# Patient Record
Sex: Female | Born: 1965 | Race: Black or African American | Hispanic: No | Marital: Married | Smoking: Never smoker
Health system: Southern US, Community
[De-identification: ages and names within clinical notes are randomized; demographics above are authoritative.]

## PROBLEM LIST (undated history)

## (undated) DIAGNOSIS — E079 Disorder of thyroid, unspecified: Secondary | ICD-10-CM

## (undated) HISTORY — PX: CHOLECYSTECTOMY: SHX55

---

## 2002-01-26 ENCOUNTER — Other Ambulatory Visit: Admission: RE | Admit: 2002-01-26 | Discharge: 2002-01-26 | Payer: Self-pay | Admitting: *Deleted

## 2002-03-06 ENCOUNTER — Ambulatory Visit (HOSPITAL_COMMUNITY): Admission: RE | Admit: 2002-03-06 | Discharge: 2002-03-06 | Payer: Self-pay | Admitting: *Deleted

## 2002-03-06 ENCOUNTER — Encounter: Payer: Self-pay | Admitting: *Deleted

## 2006-12-12 ENCOUNTER — Emergency Department (HOSPITAL_COMMUNITY): Admission: EM | Admit: 2006-12-12 | Discharge: 2006-12-12 | Payer: Self-pay | Admitting: Family Medicine

## 2015-09-18 ENCOUNTER — Other Ambulatory Visit: Payer: Self-pay

## 2015-09-18 ENCOUNTER — Emergency Department (HOSPITAL_COMMUNITY): Payer: BLUE CROSS/BLUE SHIELD

## 2015-09-18 ENCOUNTER — Encounter (HOSPITAL_COMMUNITY): Payer: Self-pay

## 2015-09-18 ENCOUNTER — Emergency Department (HOSPITAL_COMMUNITY)
Admission: EM | Admit: 2015-09-18 | Discharge: 2015-09-18 | Disposition: A | Discharge status: Home / Self Care | Payer: BLUE CROSS/BLUE SHIELD | Attending: Emergency Medicine | Admitting: Emergency Medicine

## 2015-09-18 DIAGNOSIS — R42 Dizziness and giddiness: Secondary | ICD-10-CM | POA: Diagnosis not present

## 2015-09-18 DIAGNOSIS — R634 Abnormal weight loss: Secondary | ICD-10-CM | POA: Diagnosis not present

## 2015-09-18 DIAGNOSIS — R000 Tachycardia, unspecified: Secondary | ICD-10-CM | POA: Insufficient documentation

## 2015-09-18 DIAGNOSIS — R002 Palpitations: Secondary | ICD-10-CM | POA: Diagnosis not present

## 2015-09-18 DIAGNOSIS — E069 Thyroiditis, unspecified: Secondary | ICD-10-CM | POA: Insufficient documentation

## 2015-09-18 DIAGNOSIS — E0590 Thyrotoxicosis, unspecified without thyrotoxic crisis or storm: Secondary | ICD-10-CM | POA: Diagnosis not present

## 2015-09-18 DIAGNOSIS — R197 Diarrhea, unspecified: Secondary | ICD-10-CM | POA: Insufficient documentation

## 2015-09-18 DIAGNOSIS — E010 Iodine-deficiency related diffuse (endemic) goiter: Secondary | ICD-10-CM

## 2015-09-18 DIAGNOSIS — R112 Nausea with vomiting, unspecified: Secondary | ICD-10-CM | POA: Diagnosis not present

## 2015-09-18 DIAGNOSIS — E041 Nontoxic single thyroid nodule: Secondary | ICD-10-CM

## 2015-09-18 DIAGNOSIS — R0602 Shortness of breath: Secondary | ICD-10-CM | POA: Diagnosis not present

## 2015-09-18 LAB — URINALYSIS, ROUTINE W REFLEX MICROSCOPIC
GLUCOSE, UA: NEGATIVE mg/dL
KETONES UR: 15 mg/dL — AB
Leukocytes, UA: NEGATIVE
Nitrite: NEGATIVE
PROTEIN: 30 mg/dL — AB
Specific Gravity, Urine: 1.029 (ref 1.005–1.030)
pH: 5 (ref 5.0–8.0)

## 2015-09-18 LAB — URINE MICROSCOPIC-ADD ON

## 2015-09-18 LAB — CBC
HCT: 41.4 % (ref 36.0–46.0)
Hemoglobin: 12.9 g/dL (ref 12.0–15.0)
MCH: 24.5 pg — AB (ref 26.0–34.0)
MCHC: 31.2 g/dL (ref 30.0–36.0)
MCV: 78.6 fL (ref 78.0–100.0)
PLATELETS: 217 10*3/uL (ref 150–400)
RBC: 5.27 MIL/uL — ABNORMAL HIGH (ref 3.87–5.11)
RDW: 14.7 % (ref 11.5–15.5)
WBC: 6.5 10*3/uL (ref 4.0–10.5)

## 2015-09-18 LAB — BASIC METABOLIC PANEL
Anion gap: 10 (ref 5–15)
BUN: 14 mg/dL (ref 6–20)
CHLORIDE: 106 mmol/L (ref 101–111)
CO2: 20 mmol/L — AB (ref 22–32)
CREATININE: 0.52 mg/dL (ref 0.44–1.00)
Calcium: 10.8 mg/dL — ABNORMAL HIGH (ref 8.9–10.3)
GFR calc Af Amer: >60 mL/min (ref >60)
GFR calc non Af Amer: >60 mL/min (ref >60)
GLUCOSE: 104 mg/dL — AB (ref 65–99)
Potassium: 3.9 mmol/L (ref 3.5–5.1)
Sodium: 136 mmol/L (ref 135–145)

## 2015-09-18 LAB — HEPATIC FUNCTION PANEL
ALBUMIN: 3.7 g/dL (ref 3.5–5.0)
ALT: 37 U/L (ref 14–54)
AST: 37 U/L (ref 15–41)
Alkaline Phosphatase: 183 U/L — ABNORMAL HIGH (ref 38–126)
BILIRUBIN DIRECT: 0.1 mg/dL (ref 0.1–0.5)
BILIRUBIN INDIRECT: 0.4 mg/dL (ref 0.3–0.9)
BILIRUBIN TOTAL: 0.5 mg/dL (ref 0.3–1.2)
Total Protein: 7.2 g/dL (ref 6.5–8.1)

## 2015-09-18 LAB — I-STAT TROPONIN, ED
Troponin i, poc: 0 ng/mL (ref 0.00–0.08)
Troponin i, poc: 0 ng/mL (ref 0.00–0.08)

## 2015-09-18 LAB — I-STAT BETA HCG BLOOD, ED (MC, WL, AP ONLY)

## 2015-09-18 LAB — T4, FREE: Free T4: 5.36 ng/dL — ABNORMAL HIGH (ref 0.61–1.12)

## 2015-09-18 LAB — CBG MONITORING, ED: Glucose-Capillary: 116 mg/dL — ABNORMAL HIGH (ref 65–99)

## 2015-09-18 LAB — TSH: TSH: 0.024 u[IU]/mL — AB (ref 0.350–4.500)

## 2015-09-18 MED ORDER — SODIUM CHLORIDE 0.9 % IV BOLUS (SEPSIS)
1000.0000 mL | Freq: Once | INTRAVENOUS | Status: AC
Start: 2015-09-18 — End: 2015-09-18
  Administered 2015-09-18: 1000 mL via INTRAVENOUS

## 2015-09-18 MED ORDER — ONDANSETRON HCL 8 MG PO TABS: 8.0000 mg | ORAL_TABLET | Freq: Three times a day (TID) | ORAL | 0 refills | Status: DC | PRN

## 2015-09-18 MED ORDER — METOPROLOL TARTRATE 25 MG PO TABS: 25.0000 mg | ORAL_TABLET | Freq: Every day | ORAL | 0 refills | Status: AC

## 2015-09-18 MED ORDER — METOPROLOL TARTRATE 5 MG/5ML IV SOLN
2.5000 mg | Freq: Once | INTRAVENOUS | Status: AC
  Administered 2015-09-18: 2.5 mg via INTRAVENOUS
  Filled 2015-09-18: qty 5

## 2015-09-18 MED ORDER — ONDANSETRON HCL 4 MG/2ML IJ SOLN
4.0000 mg | Freq: Once | INTRAMUSCULAR | Status: AC
  Administered 2015-09-18: 4 mg via INTRAVENOUS
  Filled 2015-09-18: qty 2

## 2015-09-18 MED ORDER — METHIMAZOLE 10 MG PO TABS: 30.0000 mg | ORAL_TABLET | Freq: Every day | ORAL | 0 refills | Status: AC

## 2015-09-18 NOTE — ED Notes (Signed)
Waiting on Troponin to result. Verbalizes no needs at this time

## 2015-09-18 NOTE — ED Provider Notes (Signed)
[facility]-EMERGENCY DEPT Provider Note   CSN: [id number] Arrival date & time: 09/18/15  9562  First Provider Contact:  First MD Initiated Contact with Patient 09/18/15 [phone]        History   Chief Complaint Chief Complaint  Patient presents with  . Weakness  . Palpitations    HPI Stacy Riggs is a 50 y.o. Otherwise healthy female, who presents to the ED with complaints of intermittent lightheadedness, palpitations, sweatiness/diaphoresis, and shortness of breath mostly with exertion that began around 4 weeks ago and has been becoming more constant. This morning she was in the shower when she developed lightheadedness, palpitations, and nausea stating that it was the worst it had been since it started, causing her to have to stop showering and rest in order to feel somewhat better. Her symptoms mostly occur with exertion, and somewhat improved with rest. She took 325 mg aspirin this morning but did not notice a difference. She vomited once this morning, nonbloody and nonbilious, but has not vomited since. She has noticed a 40 pound weight loss in 7 months, and has reported having looser stools over the last several months, states that she has had 3 episodes of nonbloody loose diarrhea stools per day. She thought she was lactose intolerant, but has tried Lactaid without any relief. Positive family history of thyroid issues but she denies any known personal history of heart or thyroid disease. She is otherwise healthy. Takes no medications.  She denies any fevers, chills, chest pain, cough, wheezing, leg swelling, recent travel/surgery/immobilization, personal or family history of DVT/PE, estrogen use, orthopnea, claudication, syncope, abdominal pain, hematemesis, constipation, obstipation, melena, hematochezia, hematemesis, dysuria, hematuria, numbness, tingling, or focal weakness. She is a nonsmoker. Denies family history of MI or CAD.   The history is provided by the patient. No language  interpreter was used.  Weakness  Associated symptoms include diaphoresis, nausea and vomiting. Pertinent negatives include no abdominal pain, arthralgias, chest pain, chills, coughing, fever, myalgias, numbness or weakness.  Palpitations   This is a recurrent problem. The current episode started more than 1 week ago. The problem occurs constantly. The problem has not changed since onset.The problem is associated with an unknown factor. Associated symptoms include diaphoresis, nausea, vomiting and shortness of breath (with exertion). Pertinent negatives include no fever, no numbness, no chest pain, no claudication, no orthopnea, no syncope, no abdominal pain, no lower extremity edema, no weakness and no cough. She has tried bed rest for the symptoms. The treatment provided moderate relief. Risk factors include family history. Her past medical history does not include hyperthyroidism.    History reviewed. No pertinent past medical history.  There are no active problems to display for this patient.   History reviewed. No pertinent surgical history.  OB History    No data available       Home Medications    Prior to Admission medications   Not on File    Family History No family history on file.  Social History Social History  Substance Use Topics  . Smoking status: Never Smoker  . Smokeless tobacco: Never Used  . Alcohol use Not on file     Allergies   Review of patient's allergies indicates no known allergies.   Review of Systems Review of Systems  Constitutional: Positive for diaphoresis and unexpected weight change (40# in 7 months). Negative for chills and fever.  Respiratory: Positive for shortness of breath (with exertion). Negative for cough and wheezing.   Cardiovascular: Positive for palpitations.  Negative for chest pain, orthopnea, claudication, leg swelling and syncope.  Gastrointestinal: Positive for diarrhea, nausea and vomiting. Negative for abdominal pain,  anal bleeding, blood in stool and constipation.  Genitourinary: Negative for dysuria and hematuria.  Musculoskeletal: Negative for arthralgias and myalgias.  Skin: Negative for color change.  Allergic/Immunologic: Negative for immunocompromised state.  Neurological: Positive for light-headedness. Negative for syncope, weakness and numbness.  Psychiatric/Behavioral: Negative for confusion.   10 Systems reviewed and are negative for acute change except as noted in the HPI.   Physical Exam Updated Vital Signs BP (!) 164/106 (BP Location: Right Arm)   Pulse 102   Temp 98.1 F (36.7 C) (Oral)   Resp 20   Ht 5\' 4"  (1.626 m)   Wt 62.6 kg   SpO2 100%   BMI 23.69 kg/m   Physical Exam  Constitutional: She is oriented to person, place, and time. Vital signs are normal. She appears well-developed and well-nourished.  Non-toxic appearance. No distress.  Afebrile, nontoxic, NAD  HENT:  Head: Normocephalic and atraumatic.  Mouth/Throat: Oropharynx is clear and moist and mucous membranes are normal.  Eyes: Conjunctivae and EOM are normal. Right eye exhibits no discharge. Left eye exhibits no discharge.  Very slight exophthalmos  Neck: Trachea normal, normal range of motion and phonation normal. Neck supple. No JVD present. No tracheal tenderness present. Carotid bruit is not present. Thyromegaly present.  Thyromegaly of the R side of the thyroid, consistent with goiter, no palpable pulsatile region, no bruit or thrill audible, no tenderness, trachea midline without deviation or tenderness, phonation WNL.   Cardiovascular: Regular rhythm, normal heart sounds and intact distal pulses.  Tachycardia present.  Exam reveals no gallop and no friction rub.   No murmur heard. Tachycardic in the 110s, reg rhythm, nl s1/s2, no m/r/g, distal pulses intact, no pedal edema  Pulmonary/Chest: Effort normal and breath sounds normal. No respiratory distress. She has no decreased breath sounds. She has no wheezes.  She has no rhonchi. She has no rales.  CTAB in all lung fields, no w/r/r, no hypoxia or increased WOB, speaking in full sentences, SpO2 100% on RA   Abdominal: Soft. Normal appearance and bowel sounds are normal. She exhibits no distension. There is no tenderness. There is no rigidity, no rebound, no guarding, no CVA tenderness, no tenderness at McBurney's point and negative Murphy's sign.  Musculoskeletal: Normal range of motion.  MAE x4 Strength and sensation grossly intact Distal pulses intact Gait steady No pedal edema, neg homan's bilaterally   Neurological: She is alert and oriented to person, place, and time. She has normal strength. No sensory deficit.  Skin: Skin is warm, dry and intact. No rash noted.  Psychiatric: She has a normal mood and affect.  Nursing note and vitals reviewed.  1610 Orthostatic Vital Signs Orthostatic Lying -  BP- Lying: 125/82  Pulse- Lying: 107  Orthostatic Sitting -  BP- Sitting: 123/95  Pulse- Sitting: 107  Orthostatic Standing at 0 minutes -  BP- Standing at 0 minutes: 119/72  Pulse- Standing at 0 minutes: 112      ED Treatments / Results  Labs (all labs ordered are listed, but only abnormal results are displayed) Labs Reviewed  BASIC METABOLIC PANEL - Abnormal; Notable for the following:       Result Value   CO2 20 (*)    Glucose, Bld 104 (*)    Calcium 10.8 (*)    All other components within normal limits  CBC - Abnormal; Notable for  the following:    RBC 5.27 (*)    MCH 24.5 (*)    All other components within normal limits  URINALYSIS, ROUTINE W REFLEX MICROSCOPIC (NOT AT [facility]) - Abnormal; Notable for the following:    Color, Urine AMBER (*)    APPearance CLOUDY (*)    Hgb urine dipstick MODERATE (*)    Bilirubin Urine SMALL (*)    Ketones, ur 15 (*)    Protein, ur 30 (*)    All other components within normal limits  TSH - Abnormal; Notable for the following:    TSH 0.024 (*)    All other components within normal limits  T4, FREE  - Abnormal; Notable for the following:    Free T4 5.36 (*)    All other components within normal limits  URINE MICROSCOPIC-ADD ON - Abnormal; Notable for the following:    Squamous Epithelial / LPF 6-30 (*)    Bacteria, UA FEW (*)    Casts HYALINE CASTS (*)    All other components within normal limits  CBG MONITORING, ED - Abnormal; Notable for the following:    Glucose-Capillary 116 (*)    All other components within normal limits  T3  THYROID STIMULATING IMMUNOGLOBULIN  HEPATIC FUNCTION PANEL  I-STAT TROPOININ, ED  I-STAT BETA HCG BLOOD, ED ([facility], [facility], [facility] ONLY)  I-STAT TROPOININ, ED    EKG  EKG Interpretation  Date/Time:  Sunday September 18 2015 08:48:32 EDT Ventricular Rate:  102 PR Interval:  136 QRS Duration: 74 QT Interval:  340 QTC Calculation: 443 R Axis:   66 Text Interpretation:  Sinus tachycardia Possible Left atrial enlargement Borderline ECG J point elevation anteriorly d/w Dr. Katrinka Blazing No previous ECGs available Confirmed by Manus Gunning  MD, STEPHEN [phone]) on 09/18/2015 9:48:15 AM       Radiology Dg Chest 2 View  Result Date: 09/18/2015 CLINICAL DATA:  Intermittent episodes of lightheadedness, nausea, tachycardia for 2 weeks EXAM: CHEST  2 VIEW COMPARISON:  None. FINDINGS: The heart size and mediastinal contours are within normal limits. Both lungs are clear. The visualized skeletal structures are unremarkable. IMPRESSION: No active cardiopulmonary disease. Electronically Signed   By: Charlett Nose M.D.   On: 09/18/2015 10:32  US Thyroid  Result Date: 09/18/2015 CLINICAL DATA:  Hyperthyroidism. EXAM: THYROID ULTRASOUND TECHNIQUE: Ultrasound examination of the thyroid gland and adjacent soft tissues was performed. COMPARISON:  None. FINDINGS: Right thyroid lobe Measurements: 5.7 x 2.8 x 3.2 cm. Thyroid tissue is heterogeneous and hypervascular. There is an anechoic structure within the superior right thyroid lobe compatible with a cyst. This cystic structure measures 1.2 x 1.1  x 0.8 cm. Left thyroid lobe Measurements: 5.4 x 2.7 x 2.4 cm. Mildly complex nodule in the superior left thyroid lobe measures 0.6 x 0.6 x 0.4 cm. Left thyroid tissue is diffusely heterogeneous and hypervascular. Echotexture of the left thyroid lobe is similar to the right thyroid lobe. Isthmus Thickness: 0.9 cm.  No nodules visualized. Lymphadenopathy None visualized. IMPRESSION: Thyroid tissue is enlarged, heterogeneous and hypervascular. These findings can be associated with thyroiditis. Bilateral thyroid nodules/cysts. Largest structure is a cyst in the right thyroid lobe measuring up to 1.2 cm. Electronically Signed   By: Richarda Overlie M.D.   On: 09/18/2015 13:33   Procedures Procedures (including critical care time)  Medications Ordered in ED Medications  sodium chloride 0.9 % bolus 1,000 mL (0 mLs Intravenous Stopped 09/18/15 1140)  ondansetron (ZOFRAN) injection 4 mg (4 mg Intravenous Given 09/18/15 1008)  metoprolol (  LOPRESSOR) injection 2.5 mg (2.5 mg Intravenous Given 09/18/15 1043)     Initial Impression / Assessment and Plan / ED Course  I have reviewed the triage vital signs and the nursing notes.  Pertinent labs & imaging results that were available during my care of the patient were reviewed by me and considered in my medical decision making (see chart for details).  Clinical Course    50 y.o. female here with intermittent lightheadedness and palpitations with exertion over the last 4 weeks, this morning while she was showering she developed more significant symptoms which prompted her to come to the ER. She also had shortness of breath, nausea, and vomited once. She has noticed that she has been having looser stools and weight loss over the last several months. On exam, tachycardic in the 110s, mildly hypertensive 164/106, no tachypnea or hypoxia, no pedal edema, right thyroid prominent on exam consistent with goiter, no audible thrill or bruit, no palpable pulsatile area on goiter.  Doesn't appear to be in thyroid storm/thyrotoxicosis, but all her symptoms are consistent with hyperthyroidism. Thyroid issues run in the family. CBG WNL, Trop neg, CBC WNL. EKG with Jpoint elevation that appeared initially to look like ST elevation but Dr. Manus Gunning my attending d/w Dr. Katrinka Blazing [facility] and he stated this doesn't seem consistent with STEMI and was more consistent with J point elevation, no acute ischemic findings.  Will obtain TSH/Free T4. U/A pending, BMP in process. Doubt need for D-dimer, especially since she denies CP, and her symptoms are most consistent with hyperthyroidism. Will check betaHCG and if neg, will give metoprolol 2.5mg  IV. Will give fluids and zofran, check orthostatics and CXR and then reassess shortly   10:25 AM Orthostatics neg, does get tachycardic with this procedure but does not meet criteria for orthostatics. HCG neg. U/A with some hgb but appears contaminated, no evidence of UTI. BMP WNL. Thyroid studies pending. CXR not yet done. Will go ahead and give metoprolol 2.5mg  now and reassess shortly.    12:15 PM  TSH low at 0.024, Free T4 elevated at 5.36. CXR clear. Will discuss case with endocrinology at [facility] to have close f/up and discuss whether we want to start PTU and beta-blockers. Palpitations and HR/BP improved after metoprolol, pt feeling much better now. Will add-on thyroid U/S while she's here, while we're waiting on endocrinology to return our call. Will also get second troponin since we're waiting on the consult call. Will reassess shortly.   12:44 PM Dr. Samuella Cota fellow of endocrinology at [facility] returning page, wants total T3 added on, and TSI antibodies as well. Also wants LFTs. Would start on propranolol 20mg  BID and methimazole 10mg  TID (counsel on WBC lowering side effect so if infx develops she needs close f/up, and LFT monitoring), and f/up in 1-2 weeks with endocrinology, will call me back on whether it needs to be their office or if there's a  better place here around [facility] for her to f/up with. Would also want U/S of thyroid, which is currently in process  1:03 PM Dr. Samuella Cota returning call, states that instead would like methimazole 30mg  daily, and metoprolol 12.5mg  BID or 25mg  daily (either way), and their office will contact pt to f/up with them.   3:00 PM  Second trop neg. U/S of thyroid showing hypervascular heterogenous enlargement of thyroid with b/l nodules/cysts with largest one on the R measuring 1.2cm diameter. Findings c/w thyroiditis. HR and BP improved after metoprolol. Symptoms improved, tolerating PO well here. Feels  much better. Rx zofran given. Will start methimazole and metoprolol as instructed previously. Will have her f/up with [facility] in 1wk to establish care and recheck, and with [facility] in 1-2wks for ongoing monitoring. I explained the diagnosis and have given explicit precautions to return to the ER including for any other new or worsening symptoms. The patient understands and accepts the medical plan as it's been dictated and I have answered their questions. Discharge instructions concerning home care and prescriptions have been given. The patient is STABLE and is discharged to home in good condition.   BP 125/66   Pulse 95   Temp 98.1 F (36.7 C) (Oral)   Resp 20   Ht 5\' 4"  (1.626 m)   Wt 62.6 kg   LMP 09/13/2015 (Approximate)   SpO2 100%   BMI 23.69 kg/m   Final Clinical Impressions(s) / ED Diagnoses   Final diagnoses:  Palpitations  Tachycardia  Lightheadedness  SOB (shortness of breath) on exertion  Nausea vomiting and diarrhea  Hyperthyroidism  Weight loss  Thyromegaly  Thyroid nodule  Thyroiditis    New Prescriptions New Prescriptions   METHIMAZOLE (TAPAZOLE) 10 MG TABLET    Take 3 tablets (30 mg total) by mouth daily.   METOPROLOL (LOPRESSOR) 25 MG TABLET    Take 1 tablet (25 mg total) by mouth daily.   ONDANSETRON (ZOFRAN) 8 MG TABLET    Take 1 tablet (8 mg total) by mouth  every 8 (eight) hours as needed for nausea or vomiting.     France Ravens Camprubi-Soms, PA-C 09/18/15 1520    Glynn Octave, MD 09/18/15 [phone]

## 2015-09-18 NOTE — ED Notes (Signed)
Mercedes, PA at the bedside.  

## 2015-09-18 NOTE — Discharge Instructions (Signed)
Start taking metoprolol and methimazole as directed to help with your hyperthyroidism. Take zofran as needed for nausea, stay well hydrated. Follow up with [facility] in 1 week to establish care and recheck symptoms. Follow up with [facility] (they should be calling you soon to schedule an appointment) in 1-2 weeks for ongoing management of your hyperthyroidism. Return to the ER for changes or worsening symptoms.

## 2015-09-18 NOTE — ED Triage Notes (Signed)
Patient complains of 2 weeks of intermittent dizziness and weakness when trying to shower and get ready in mornings. States that this am the weakness and palpitations were the worse and actually had to sit down while trying to perform her morning ADL's. On arrival alert and oriented. Denies CP, denies shortness of breath. No medical history.

## 2015-09-18 NOTE — ED Notes (Signed)
Pt returned from X-ray.  

## 2015-09-18 NOTE — ED Notes (Signed)
PA at the bedside.

## 2015-09-18 NOTE — ED Notes (Signed)
MD Rancour at the bedside.   

## 2015-09-20 LAB — T3

## 2015-09-29 LAB — THYROID STIMULATING IMMUNOGLOBULIN: Thyroid Stimulating Immunoglob: 549 % — ABNORMAL HIGH (ref 0–139)

## 2015-10-04 DIAGNOSIS — Z1151 Encounter for screening for human papillomavirus (HPV): Secondary | ICD-10-CM | POA: Diagnosis not present

## 2015-10-04 DIAGNOSIS — Z1231 Encounter for screening mammogram for malignant neoplasm of breast: Secondary | ICD-10-CM | POA: Diagnosis not present

## 2015-10-04 DIAGNOSIS — Z6822 Body mass index (BMI) 22.0-22.9, adult: Secondary | ICD-10-CM | POA: Diagnosis not present

## 2015-10-04 DIAGNOSIS — Z01419 Encounter for gynecological examination (general) (routine) without abnormal findings: Secondary | ICD-10-CM | POA: Diagnosis not present

## 2015-10-06 ENCOUNTER — Encounter (HOSPITAL_COMMUNITY): Payer: Self-pay | Admitting: Emergency Medicine

## 2015-10-06 ENCOUNTER — Ambulatory Visit (HOSPITAL_COMMUNITY)
Admission: EM | Admit: 2015-10-06 | Discharge: 2015-10-06 | Disposition: A | Discharge status: Home / Self Care | Payer: BLUE CROSS/BLUE SHIELD

## 2015-10-06 DIAGNOSIS — T7840XA Allergy, unspecified, initial encounter: Secondary | ICD-10-CM

## 2015-10-06 DIAGNOSIS — L509 Urticaria, unspecified: Secondary | ICD-10-CM

## 2015-10-06 NOTE — ED Triage Notes (Signed)
Here for rash all over body onset yest... Reports whelps have decreased but still itching all over... Denies fevers.... Also reports she's on new thyroid medication .... A&O x4... NAD

## 2015-10-06 NOTE — ED Provider Notes (Signed)
CSN: [id number]     Arrival date & time 10/06/15  1446 History   First MD Initiated Contact with Patient 10/06/15 1548     Chief Complaint  Patient presents with  . Rash   (Consider location/radiation/quality/duration/timing/severity/associated sxs/prior Treatment) 50 year old female is accompanied by a relative with concern over possible allergic reaction to? Medication or food that she had yesterday. She ate fish at lunchtime and started breaking out in hives with itching 2 to 3 hours later. She has never had an allergy to fish or shellfish. She had already started breaking out in a rash when she took her thyroid medication yesterday evening. No change in her medication recently. She is only on thyroid and blood pressure medication. No NSAIDs. The hives and itchiness are subsiding. She denies any difficulty breathing, or swelling. She did not take any medication/Benadryl for her symptoms yet.    The history is provided by the patient and a relative.    History reviewed. No pertinent past medical history. History reviewed. No pertinent surgical history. History reviewed. No pertinent family history. Social History  Substance Use Topics  . Smoking status: Never Smoker  . Smokeless tobacco: Never Used  . Alcohol use Not on file   OB History    No data available     Review of Systems  Constitutional: Negative for fatigue and fever.  HENT: Negative for facial swelling.   Respiratory: Negative for shortness of breath and wheezing.   Cardiovascular: Negative for chest pain.  Skin: Positive for rash.  Neurological: Negative for headaches.    Allergies  Lactose intolerance (gi)  Home Medications   Prior to Admission medications   Medication Sig Start Date End Date Taking? Authorizing Provider  methimazole (TAPAZOLE) 10 MG tablet Take 3 tablets (30 mg total) by mouth daily. 09/18/15  Yes Mercedes Camprubi-Soms, PA-C  metoprolol (LOPRESSOR) 25 MG tablet Take 1 tablet (25 mg total)  by mouth daily. 09/18/15  Yes Mercedes Camprubi-Soms, PA-C  aspirin 325 MG tablet Take 325 mg by mouth every 6 (six) hours as needed for moderate pain.    Historical Provider, MD  lactase (LACTAID) 3000 units tablet Take 3,000 Units by mouth daily as needed (for dairy).    Historical Provider, MD  naproxen sodium (ANAPROX) 220 MG tablet Take 220 mg by mouth 2 (two) times daily as needed (for pain).    Historical Provider, MD   Meds Ordered and Administered this Visit  Medications - No data to display  BP 141/89 (BP Location: Left Arm)   Pulse 104   Temp 99.3 F (37.4 C) (Oral)   Resp 16   LMP 09/13/2015 (Approximate)  No data found.   Physical Exam  Constitutional: She is oriented to person, place, and time. She appears well-developed and well-nourished. No distress.  HENT:  Head: Normocephalic and atraumatic.  Mouth/Throat: Uvula is midline, oropharynx is clear and moist and mucous membranes are normal.  Neck: Normal range of motion. Neck supple.  Cardiovascular: Normal rate, regular rhythm and normal heart sounds.   Pulmonary/Chest: Effort normal and breath sounds normal.  Lymphadenopathy:    She has no cervical adenopathy.  Neurological: She is alert and oriented to person, place, and time.  Skin: Skin is warm, dry and intact. Capillary refill takes less than 2 seconds. Rash noted. Rash is urticarial.     A few scattered pink raised wheals present on arms, legs, neck, chest and back. No tenderness. No discharge. No excoriation.   Psychiatric: She has  a normal mood and affect. Her behavior is normal. Judgment and thought content normal.    Urgent Care Course   Clinical Course    Procedures (including critical care time)  Labs Review Labs Reviewed - No data to display  Imaging Review No results found.   Visual Acuity Review  Right Eye Distance:   Left Eye Distance:   Bilateral Distance:    Right Eye Near:   Left Eye Near:    Bilateral Near:         MDM     1. Urticaria   2. Allergic reaction, initial encounter    Discussed with patient and her relative that she probably had an allergic reaction to fish (uncertain if it was the fish itself or seasoning/how it was prepared)- most likely not a reaction to her thyroid medication. Offered Benadryl or DepoMedrol IM today- pt declined. Since her reaction continues to decrease and itching has almost subsided, she plans to take Benadryl OTC 25mg  when she gets home. Discussed that she should be able to take her thyroid medication tonight but monitor for any reaction. If hives, itching or any swelling occurs, go to ER. Otherwise follow-up with her primary care provider as needed.    Sudie GrumblingAnn Berry Izeah Vossler, NP 10/07/15 1014

## 2015-10-06 NOTE — Discharge Instructions (Signed)
Recommend OTC Benadryl 25mg  as needed for itching. May continue to take thyroid medication since allergic reaction most likely due to something she ate. Recommend go to ER if hives/itching worsens.

## 2015-10-08 ENCOUNTER — Encounter (HOSPITAL_COMMUNITY): Payer: Self-pay | Admitting: Emergency Medicine

## 2015-10-08 ENCOUNTER — Emergency Department (HOSPITAL_COMMUNITY)
Admission: EM | Admit: 2015-10-08 | Discharge: 2015-10-08 | Disposition: A | Discharge status: Home / Self Care | Payer: BLUE CROSS/BLUE SHIELD | Attending: Emergency Medicine | Admitting: Emergency Medicine

## 2015-10-08 DIAGNOSIS — L509 Urticaria, unspecified: Secondary | ICD-10-CM | POA: Diagnosis not present

## 2015-10-08 HISTORY — DX: Disorder of thyroid, unspecified: E07.9

## 2015-10-08 LAB — CBC WITH DIFFERENTIAL/PLATELET
BASOS ABS: 0 10*3/uL (ref 0.0–0.1)
BASOS PCT: 0 %
Eosinophils Absolute: 0 10*3/uL (ref 0.0–0.7)
Eosinophils Relative: 1 %
HEMATOCRIT: 41 % (ref 36.0–46.0)
HEMOGLOBIN: 12.7 g/dL (ref 12.0–15.0)
LYMPHS PCT: 20 %
Lymphs Abs: 1.3 10*3/uL (ref 0.7–4.0)
MCH: 24.8 pg — ABNORMAL LOW (ref 26.0–34.0)
MCHC: 31 g/dL (ref 30.0–36.0)
MCV: 79.9 fL (ref 78.0–100.0)
Monocytes Absolute: 0.3 10*3/uL (ref 0.1–1.0)
Monocytes Relative: 4 %
NEUTROS ABS: 4.8 10*3/uL (ref 1.7–7.7)
NEUTROS PCT: 75 %
Platelets: 229 10*3/uL (ref 150–400)
RBC: 5.13 MIL/uL — AB (ref 3.87–5.11)
RDW: 14.2 % (ref 11.5–15.5)
WBC: 6.4 10*3/uL (ref 4.0–10.5)

## 2015-10-08 LAB — BASIC METABOLIC PANEL
ANION GAP: 11 (ref 5–15)
BUN: 11 mg/dL (ref 6–20)
CALCIUM: 9.7 mg/dL (ref 8.9–10.3)
CO2: 22 mmol/L (ref 22–32)
Chloride: 105 mmol/L (ref 101–111)
Creatinine, Ser: 0.56 mg/dL (ref 0.44–1.00)
Glucose, Bld: 106 mg/dL — ABNORMAL HIGH (ref 65–99)
POTASSIUM: 3.8 mmol/L (ref 3.5–5.1)
Sodium: 138 mmol/L (ref 135–145)

## 2015-10-08 LAB — TSH: TSH: 0.019 u[IU]/mL — ABNORMAL LOW (ref 0.350–4.500)

## 2015-10-08 LAB — T4, FREE: FREE T4: 2.53 ng/dL — AB (ref 0.61–1.12)

## 2015-10-08 MED ORDER — HYDROXYZINE HCL 25 MG PO TABS: 25.0000 mg | ORAL_TABLET | Freq: Four times a day (QID) | ORAL | 0 refills | Status: AC

## 2015-10-08 MED ORDER — FAMOTIDINE IN NACL 20-0.9 MG/50ML-% IV SOLN
20.0000 mg | Freq: Once | INTRAVENOUS | Status: AC
  Administered 2015-10-08: 20 mg via INTRAVENOUS
  Filled 2015-10-08: qty 50

## 2015-10-08 MED ORDER — METHYLPREDNISOLONE SODIUM SUCC 125 MG IJ SOLR
125.0000 mg | Freq: Once | INTRAMUSCULAR | Status: AC
  Administered 2015-10-08: 125 mg via INTRAVENOUS
  Filled 2015-10-08: qty 2

## 2015-10-08 MED ORDER — DIPHENHYDRAMINE HCL 50 MG/ML IJ SOLN
25.0000 mg | Freq: Once | INTRAMUSCULAR | Status: AC
  Administered 2015-10-08: 25 mg via INTRAVENOUS
  Filled 2015-10-08: qty 1

## 2015-10-08 MED ORDER — PREDNISONE 10 MG (21) PO TBPK: 10.0000 mg | ORAL_TABLET | Freq: Every day | ORAL | 0 refills | Status: AC

## 2015-10-08 NOTE — ED Provider Notes (Signed)
[facility]-EMERGENCY DEPT Provider Note   CSN: [id number] Arrival date & time: 10/08/15  0003     History   Chief Complaint Chief Complaint  Patient presents with  . Urticaria    HPI Stacy Riggs is a 50 y.o. female.  Pt here tonight because of an itchy rash.  She believes it is secondary to her thyroid medication that was started 2 weeks ago.  The pt did not have any sx until 2 days ago.  The pt said that she has not taken any otc meds for the itch.  She did go to urgent care on the 17th for the same, but elected not to treat the rash with anything as she thought it was getting better.  Pt said that she was unable to sleep tonight due to the itching.      Past Medical History:  Diagnosis Date  . Thyroid disease     There are no active problems to display for this patient.   Past Surgical History:  Procedure Laterality Date  . CHOLECYSTECTOMY      OB History    No data available       Home Medications    Prior to Admission medications   Medication Sig Start Date End Date Taking? Authorizing Provider  methimazole (TAPAZOLE) 10 MG tablet Take 3 tablets (30 mg total) by mouth daily. 09/18/15  Yes Mercedes Camprubi-Soms, PA-C  metoprolol (LOPRESSOR) 25 MG tablet Take 1 tablet (25 mg total) by mouth daily. 09/18/15  Yes Mercedes Camprubi-Soms, PA-C  hydrOXYzine (ATARAX/VISTARIL) 25 MG tablet Take 1 tablet (25 mg total) by mouth every 6 (six) hours. 10/08/15   Jacalyn LefevreJulie Keshauna Degraffenreid, MD  predniSONE (STERAPRED UNI-PAK 21 TAB) 10 MG (21) TBPK tablet Take 1 tablet (10 mg total) by mouth daily. Take 6 tabs by mouth daily  for 2 days, then 5 tabs for 2 days, then 4 tabs for 2 days, then 3 tabs for 2 days, 2 tabs for 2 days, then 1 tab by mouth daily for 2 days 10/08/15   Jacalyn LefevreJulie Dravin Lance, MD    Family History No family history on file.  Social History Social History  Substance Use Topics  . Smoking status: Never Smoker  . Smokeless tobacco: Never Used  . Alcohol use No      Allergies   Review of patient's allergies indicates no known allergies.   Review of Systems Review of Systems  Skin: Positive for rash.  All other systems reviewed and are negative.    Physical Exam Updated Vital Signs BP 124/74   Pulse 90   Temp 98.8 F (37.1 C) (Oral)   Resp 18   Ht 5\' 3"  (1.6 m)   Wt 153 lb (69.4 kg)   LMP 09/13/2015 (Approximate)   SpO2 99%   BMI 27.10 kg/m   Physical Exam  Constitutional: She is oriented to person, place, and time. She appears well-developed and well-nourished.  HENT:  Head: Normocephalic and atraumatic.  Right Ear: External ear normal.  Left Ear: External ear normal.  Nose: Nose normal.  Mouth/Throat: Oropharynx is clear and moist.  Eyes: Conjunctivae and EOM are normal. Pupils are equal, round, and reactive to light.  Neck: Normal range of motion. Neck supple.  Cardiovascular: Normal rate, regular rhythm, normal heart sounds and intact distal pulses.   Pulmonary/Chest: Effort normal and breath sounds normal.  Abdominal: Soft. Bowel sounds are normal.  Musculoskeletal: Normal range of motion.  Neurological: She is alert and oriented to person, place, and time.  Skin: Rash noted.  Urticaria on neck, chest, arms, legs  Psychiatric: She has a normal mood and affect. Her behavior is normal. Judgment and thought content normal.  Nursing note and vitals reviewed.    ED Treatments / Results  Labs (all labs ordered are listed, but only abnormal results are displayed) Labs Reviewed  BASIC METABOLIC PANEL - Abnormal; Notable for the following:       Result Value   Glucose, Bld 106 (*)    All other components within normal limits  CBC WITH DIFFERENTIAL/PLATELET - Abnormal; Notable for the following:    RBC 5.13 (*)    MCH 24.8 (*)    All other components within normal limits  TSH - Abnormal; Notable for the following:    TSH 0.019 (*)    All other components within normal limits  T4, FREE - Abnormal; Notable for the  following:    Free T4 2.53 (*)    All other components within normal limits    EKG  EKG Interpretation None       Radiology No results found.  Procedures Procedures (including critical care time)  Medications Ordered in ED Medications  methylPREDNISolone sodium succinate (SOLU-MEDROL) 125 mg/2 mL injection 125 mg (125 mg Intravenous Given 10/08/15 0548)  diphenhydrAMINE (BENADRYL) injection 25 mg (25 mg Intravenous Given 10/08/15 0549)  famotidine (PEPCID) IVPB 20 mg premix (0 mg Intravenous Stopped 10/08/15 0636)     Initial Impression / Assessment and Plan / ED Course  I have reviewed the triage vital signs and the nursing notes.  Pertinent labs & imaging results that were available during my care of the patient were reviewed by me and considered in my medical decision making (see chart for details).  Clinical Course    Pt has improved.  She has an appt with her endocrinologist in a few days.  She knows to return if worse.  For now, I want pt to continue her thyroid meds.  Final Clinical Impressions(s) / ED Diagnoses   Final diagnoses:  Urticaria    New Prescriptions Discharge Medication List as of 10/08/2015  6:30 AM    START taking these medications   Details  hydrOXYzine (ATARAX/VISTARIL) 25 MG tablet Take 1 tablet (25 mg total) by mouth every 6 (six) hours., Starting Sat 10/08/2015, Print    predniSONE (STERAPRED UNI-PAK 21 TAB) 10 MG (21) TBPK tablet Take 1 tablet (10 mg total) by mouth daily. Take 6 tabs by mouth daily  for 2 days, then 5 tabs for 2 days, then 4 tabs for 2 days, then 3 tabs for 2 days, 2 tabs for 2 days, then 1 tab by mouth daily for 2 days, Starting Sat 10/08/2015, Print         Jacalyn LefevreJulie Lyndel Sarate, MD 10/08/15 [phone]

## 2015-10-08 NOTE — ED Triage Notes (Signed)
Pt. reports itchy hives/rashes at legs and hands onset yesterday , denies SOB , no oral swelling/ airway intact .

## 2015-10-08 NOTE — ED Notes (Signed)
The pt has not slept for the past few nightgs from itching rash that she has had for sev erfal days

## 2015-10-12 ENCOUNTER — Other Ambulatory Visit (HOSPITAL_COMMUNITY): Payer: Self-pay | Admitting: Endocrinology

## 2015-10-12 DIAGNOSIS — R002 Palpitations: Secondary | ICD-10-CM | POA: Diagnosis not present

## 2015-10-12 DIAGNOSIS — E0590 Thyrotoxicosis, unspecified without thyrotoxic crisis or storm: Secondary | ICD-10-CM

## 2015-10-17 ENCOUNTER — Encounter (HOSPITAL_COMMUNITY): Payer: Self-pay | Admitting: Radiology

## 2015-10-17 ENCOUNTER — Encounter (HOSPITAL_COMMUNITY)
Admission: RE | Admit: 2015-10-17 | Discharge: 2015-10-17 | Disposition: A | Discharge status: Home / Self Care | Payer: BLUE CROSS/BLUE SHIELD | Source: Ambulatory Visit | Attending: Endocrinology | Admitting: Endocrinology

## 2015-10-17 DIAGNOSIS — E0590 Thyrotoxicosis, unspecified without thyrotoxic crisis or storm: Secondary | ICD-10-CM | POA: Diagnosis not present

## 2015-10-17 LAB — HCG, SERUM, QUALITATIVE: PREG SERUM: NEGATIVE

## 2015-10-17 MED ORDER — SODIUM IODIDE I 131 CAPSULE
6.9600 | Freq: Once | INTRAVENOUS | Status: AC | PRN
  Administered 2015-10-17: 6.96 via ORAL

## 2015-10-18 ENCOUNTER — Encounter (HOSPITAL_COMMUNITY)
Admission: RE | Admit: 2015-10-18 | Discharge: 2015-10-18 | Disposition: A | Discharge status: Home / Self Care | Payer: BLUE CROSS/BLUE SHIELD | Source: Ambulatory Visit | Attending: Endocrinology | Admitting: Endocrinology

## 2015-10-18 DIAGNOSIS — E0590 Thyrotoxicosis, unspecified without thyrotoxic crisis or storm: Secondary | ICD-10-CM | POA: Insufficient documentation

## 2015-10-18 MED ORDER — SODIUM PERTECHNETATE TC 99M INJECTION
9.6000 | Freq: Once | INTRAVENOUS | Status: AC | PRN
  Administered 2015-10-18: 9.6 via INTRAVENOUS

## 2015-11-23 ENCOUNTER — Other Ambulatory Visit (HOSPITAL_COMMUNITY): Payer: Self-pay | Admitting: Endocrinology

## 2015-11-23 DIAGNOSIS — E0590 Thyrotoxicosis, unspecified without thyrotoxic crisis or storm: Secondary | ICD-10-CM

## 2015-12-06 ENCOUNTER — Encounter (HOSPITAL_COMMUNITY)
Admission: RE | Admit: 2015-12-06 | Discharge: 2015-12-06 | Disposition: A | Discharge status: Home / Self Care | Payer: BLUE CROSS/BLUE SHIELD | Source: Ambulatory Visit | Attending: Endocrinology | Admitting: Endocrinology

## 2015-12-06 DIAGNOSIS — E0590 Thyrotoxicosis, unspecified without thyrotoxic crisis or storm: Secondary | ICD-10-CM | POA: Diagnosis not present

## 2015-12-06 LAB — HCG, SERUM, QUALITATIVE: Preg, Serum: NEGATIVE

## 2015-12-06 MED ORDER — SODIUM IODIDE I 131 CAPSULE
14.5000 | Freq: Once | INTRAVENOUS | Status: AC | PRN
  Administered 2015-12-06: 14.5 via ORAL

## 2015-12-08 DIAGNOSIS — E0590 Thyrotoxicosis, unspecified without thyrotoxic crisis or storm: Secondary | ICD-10-CM | POA: Diagnosis not present

## 2016-01-26 DIAGNOSIS — E0590 Thyrotoxicosis, unspecified without thyrotoxic crisis or storm: Secondary | ICD-10-CM | POA: Diagnosis not present

## 2016-02-02 DIAGNOSIS — E0590 Thyrotoxicosis, unspecified without thyrotoxic crisis or storm: Secondary | ICD-10-CM | POA: Diagnosis not present

## 2016-03-29 DIAGNOSIS — E0590 Thyrotoxicosis, unspecified without thyrotoxic crisis or storm: Secondary | ICD-10-CM | POA: Diagnosis not present

## 2016-04-05 DIAGNOSIS — E890 Postprocedural hypothyroidism: Secondary | ICD-10-CM | POA: Diagnosis not present

## 2016-04-05 DIAGNOSIS — E0590 Thyrotoxicosis, unspecified without thyrotoxic crisis or storm: Secondary | ICD-10-CM | POA: Diagnosis not present

## 2016-05-16 DIAGNOSIS — E890 Postprocedural hypothyroidism: Secondary | ICD-10-CM | POA: Diagnosis not present

## 2016-07-05 DIAGNOSIS — E890 Postprocedural hypothyroidism: Secondary | ICD-10-CM | POA: Diagnosis not present

## 2016-08-16 DIAGNOSIS — E890 Postprocedural hypothyroidism: Secondary | ICD-10-CM | POA: Diagnosis not present

## 2016-10-08 DIAGNOSIS — E890 Postprocedural hypothyroidism: Secondary | ICD-10-CM | POA: Diagnosis not present

## 2016-10-11 DIAGNOSIS — E890 Postprocedural hypothyroidism: Secondary | ICD-10-CM | POA: Diagnosis not present

## 2017-04-15 DIAGNOSIS — E890 Postprocedural hypothyroidism: Secondary | ICD-10-CM | POA: Diagnosis not present

## 2017-06-13 DIAGNOSIS — E0590 Thyrotoxicosis, unspecified without thyrotoxic crisis or storm: Secondary | ICD-10-CM | POA: Diagnosis not present

## 2017-08-13 DIAGNOSIS — E890 Postprocedural hypothyroidism: Secondary | ICD-10-CM | POA: Diagnosis not present

## 2017-10-05 IMAGING — US US THYROID
1 series · 14 of 25 positions shown · non-contrast
Comparison: None.

CLINICAL DATA: Hyperthyroidism.

EXAM:
THYROID ULTRASOUND
TECHNIQUE: Ultrasound examination of the thyroid gland and adjacent soft
tissues was performed.

[Series 1: us thyroid · 0.06mm/px · 14 of 48 slices shown]
[im 1/48]
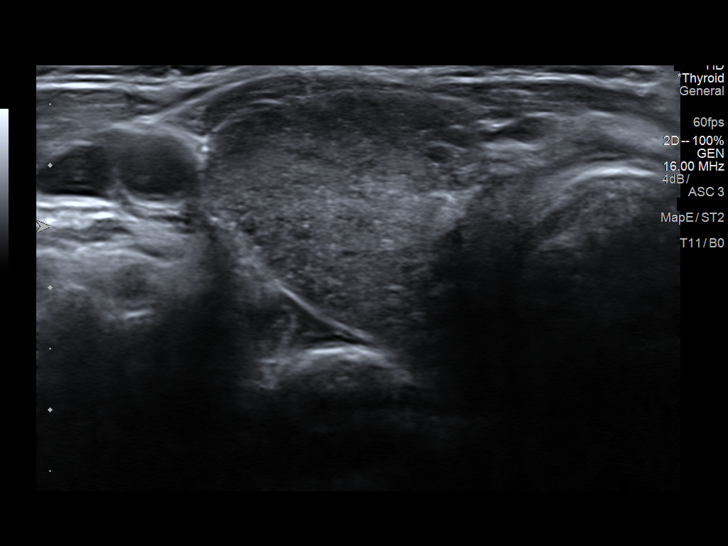
[im 4/48]
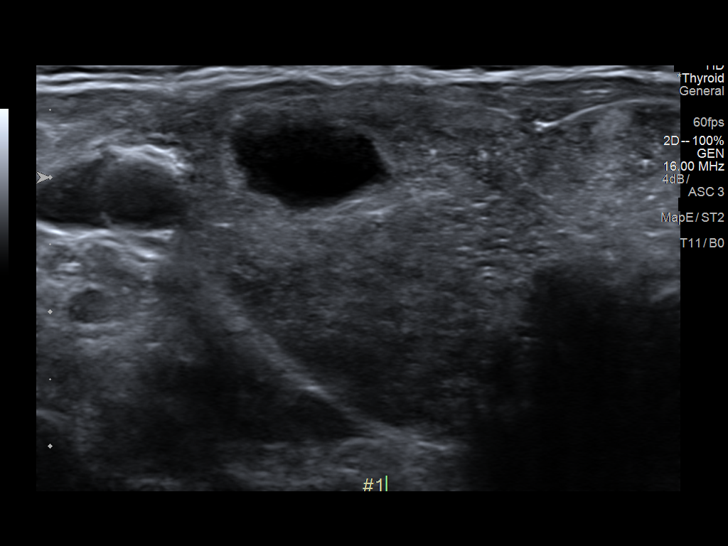
[im 8/48]
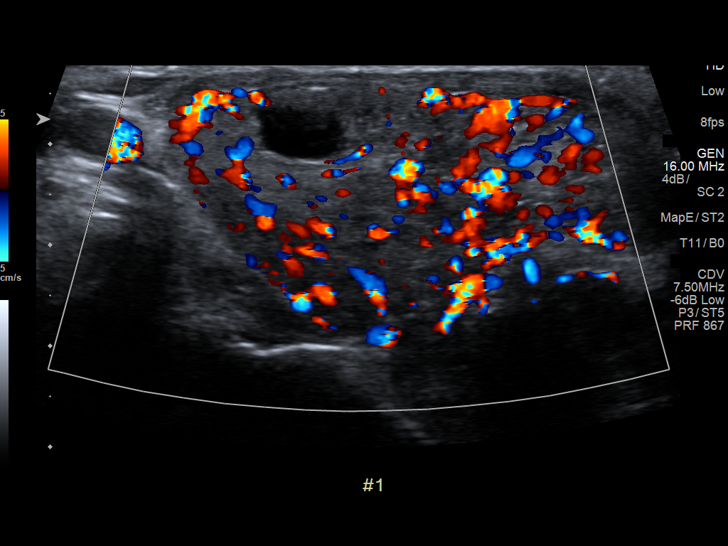
[im 12/48]
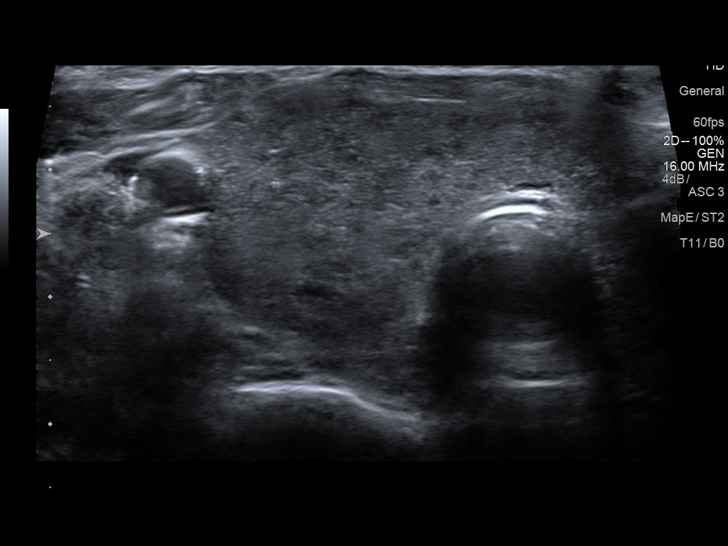
[im 16/48]
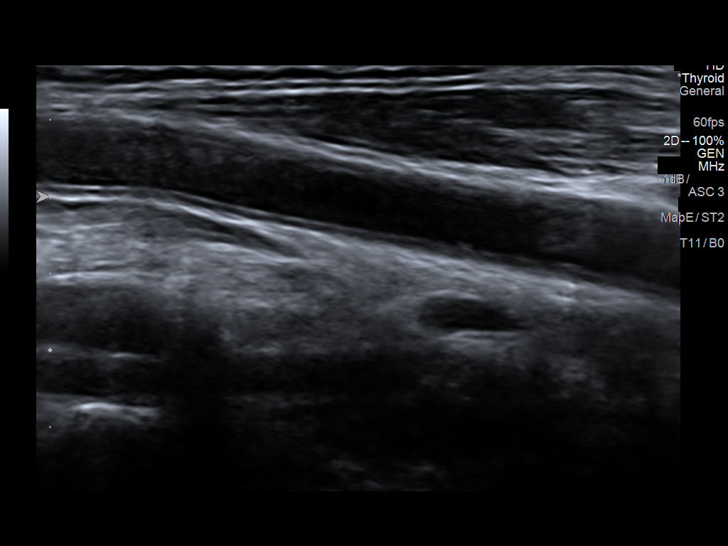
[im 18/48]
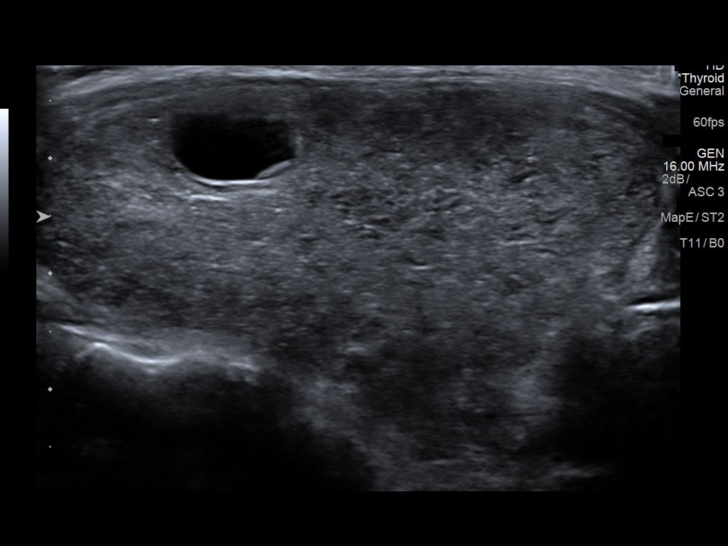
[im 22/48]
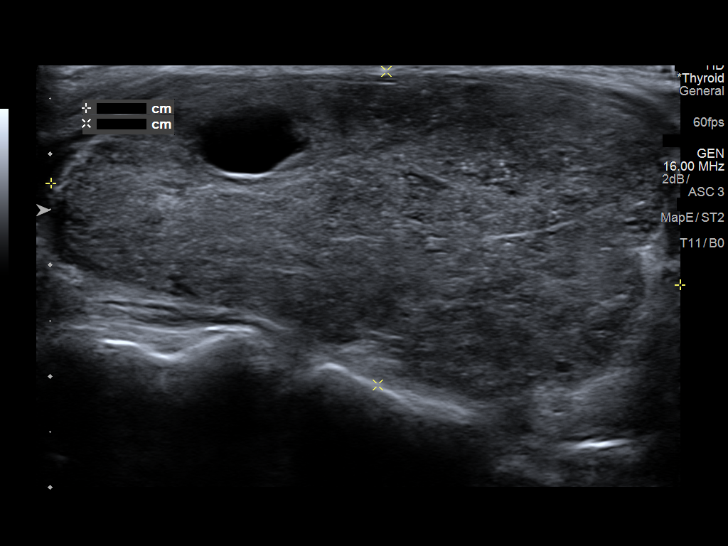
[im 26/48]
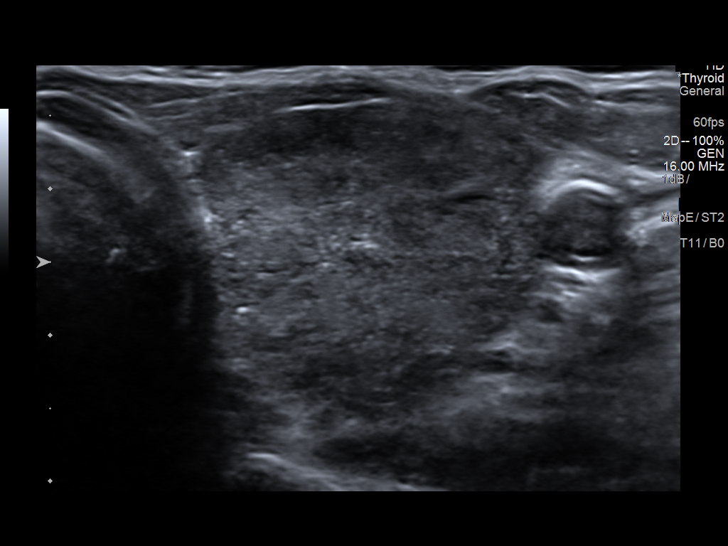
[im 30/48]
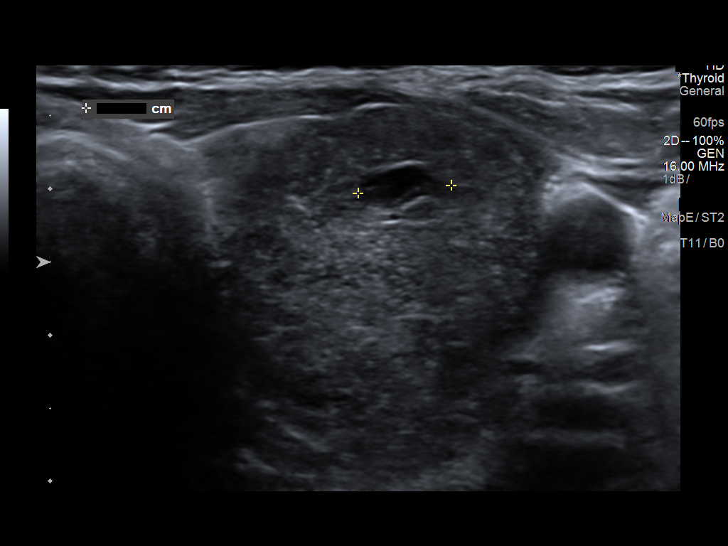
[im 32/48]
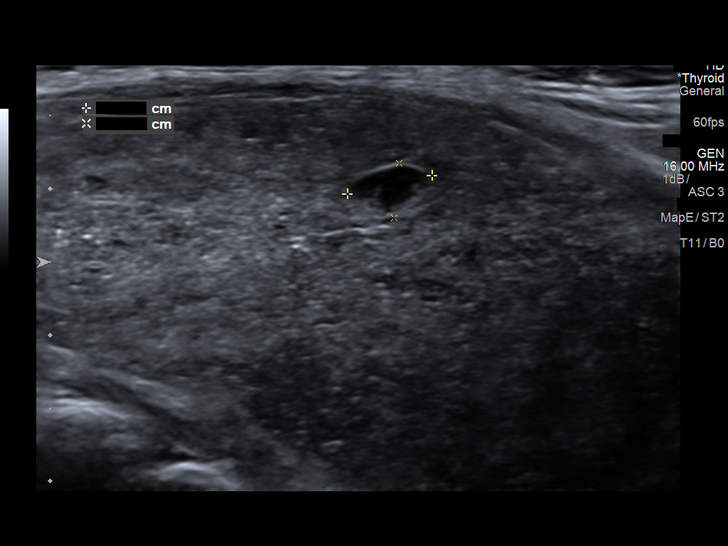
[im 36/48]
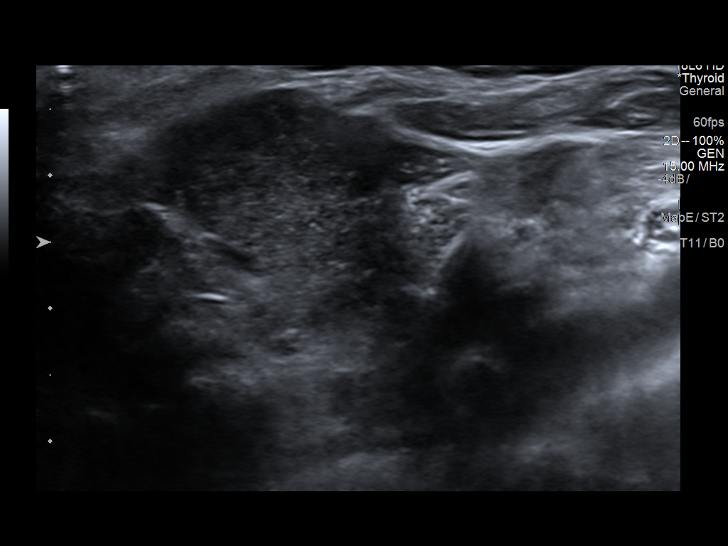
[im 40/48]
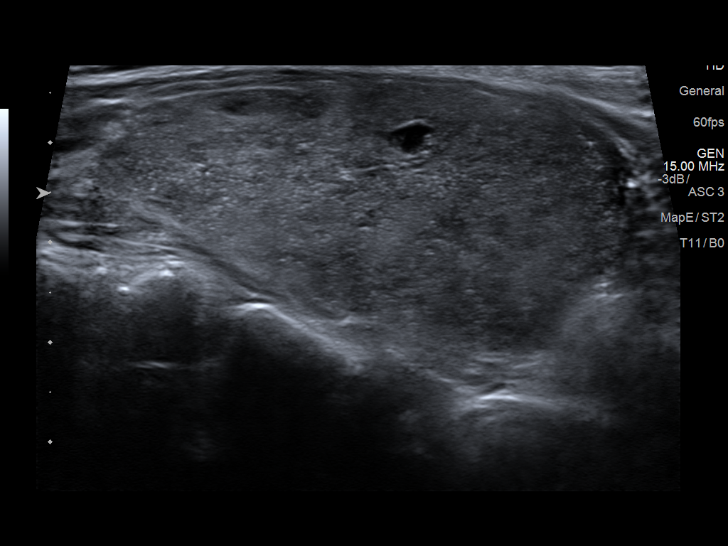
[im 44/48]
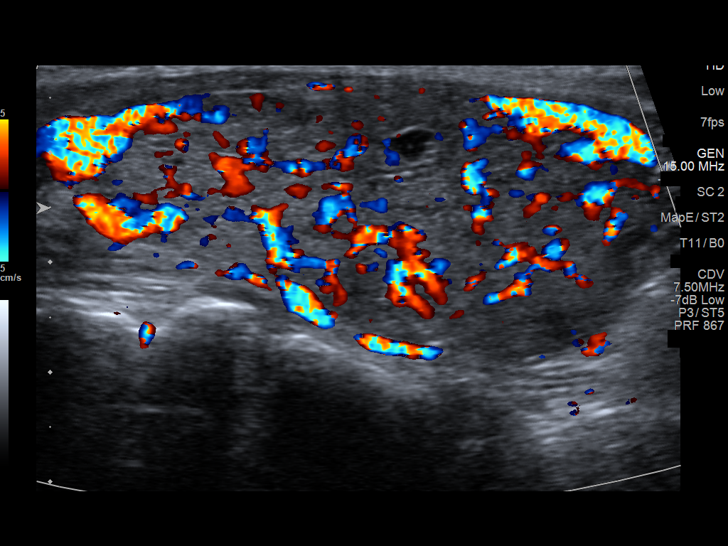
[im 48/48]
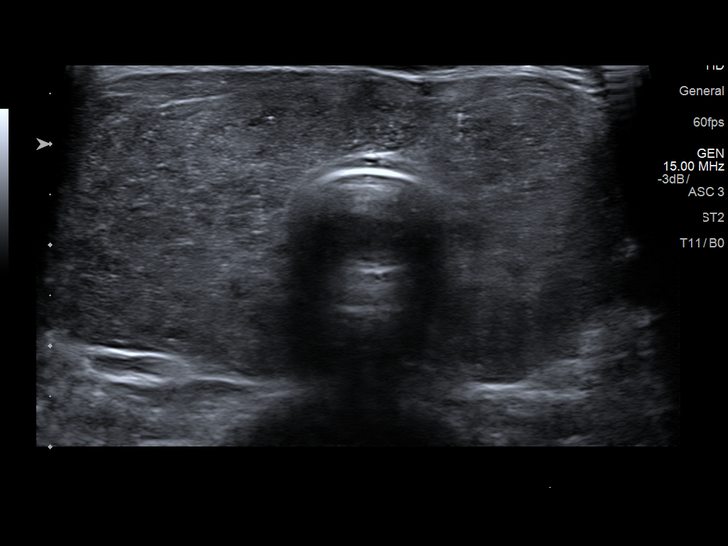

[14 of 25 positions shown; findings below may reference images not displayed]

FINDINGS: Right thyroid lobe

Measurements: 5.7 x 2.8 x 3.2 cm. Thyroid tissue is heterogeneous
and hypervascular. There is an anechoic structure within the
superior right thyroid lobe compatible with a cyst. This cystic
structure measures 1.2 x 1.1 x 0.8 cm.

Left thyroid lobe

Measurements: 5.4 x 2.7 x 2.4 cm. Mildly complex nodule in the
superior left thyroid lobe measures 0.6 x 0.6 x 0.4 cm. Left thyroid
tissue is diffusely heterogeneous and hypervascular. Echotexture of
the left thyroid lobe is similar to the right thyroid lobe.

Isthmus

Thickness: 0.9 cm.  No nodules visualized.

Lymphadenopathy

None visualized.
IMPRESSION: Thyroid tissue is enlarged, heterogeneous and hypervascular. These
findings can be associated with thyroiditis.

Bilateral thyroid nodules/cysts. Largest structure is a cyst in the
right thyroid lobe measuring up to 1.2 cm.

## 2023-05-14 DIAGNOSIS — E890 Postprocedural hypothyroidism: Secondary | ICD-10-CM | POA: Diagnosis not present

## 2023-05-21 DIAGNOSIS — E890 Postprocedural hypothyroidism: Secondary | ICD-10-CM | POA: Diagnosis not present

## 2023-07-23 DIAGNOSIS — E890 Postprocedural hypothyroidism: Secondary | ICD-10-CM | POA: Diagnosis not present

## 2023-11-06 DIAGNOSIS — E890 Postprocedural hypothyroidism: Secondary | ICD-10-CM | POA: Diagnosis not present

## 2023-11-11 DIAGNOSIS — E890 Postprocedural hypothyroidism: Secondary | ICD-10-CM | POA: Diagnosis not present

## 2024-01-07 DIAGNOSIS — E890 Postprocedural hypothyroidism: Secondary | ICD-10-CM | POA: Diagnosis not present

## 2024-01-14 DIAGNOSIS — E890 Postprocedural hypothyroidism: Secondary | ICD-10-CM | POA: Diagnosis not present
